# Patient Record
Sex: Female | Born: 1965 | Race: Black or African American | Hispanic: No | Smoking: Never smoker
Health system: Southern US, Community
[De-identification: ages and names within clinical notes are randomized; demographics above are authoritative.]

## PROBLEM LIST (undated history)

## (undated) HISTORY — PX: HERNIA REPAIR: SHX51

## (undated) HISTORY — PX: FOOT SURGERY: SHX648

---

## 2015-05-13 ENCOUNTER — Encounter (HOSPITAL_BASED_OUTPATIENT_CLINIC_OR_DEPARTMENT_OTHER): Payer: Self-pay | Admitting: *Deleted

## 2015-05-13 ENCOUNTER — Emergency Department (HOSPITAL_BASED_OUTPATIENT_CLINIC_OR_DEPARTMENT_OTHER)
Admission: EM | Admit: 2015-05-13 | Discharge: 2015-05-13 | Disposition: A | Discharge status: Home / Self Care | Payer: 59 | Attending: Emergency Medicine | Admitting: Emergency Medicine

## 2015-05-13 DIAGNOSIS — R1084 Generalized abdominal pain: Secondary | ICD-10-CM | POA: Diagnosis present

## 2015-05-13 DIAGNOSIS — R110 Nausea: Secondary | ICD-10-CM | POA: Insufficient documentation

## 2015-05-13 DIAGNOSIS — R197 Diarrhea, unspecified: Secondary | ICD-10-CM | POA: Diagnosis not present

## 2015-05-13 MED ORDER — ONDANSETRON 4 MG PO TBDP: ORAL_TABLET | ORAL | Status: AC

## 2015-05-13 MED ORDER — SODIUM CHLORIDE 0.9 % IV BOLUS (SEPSIS)
1000.0000 mL | Freq: Once | INTRAVENOUS | Status: AC
  Administered 2015-05-13: 1000 mL via INTRAVENOUS

## 2015-05-13 MED ORDER — ONDANSETRON HCL 4 MG/2ML IJ SOLN
4.0000 mg | Freq: Once | INTRAMUSCULAR | Status: AC
  Administered 2015-05-13: 4 mg via INTRAVENOUS
  Filled 2015-05-13: qty 2

## 2015-05-13 NOTE — ED Notes (Signed)
D/c home with rx x 1 for zofran- family present to drive

## 2015-05-13 NOTE — ED Notes (Signed)
Abdominal pain, nausea, body aches, diarrhea, and headache. Symptoms started this am.

## 2015-05-13 NOTE — ED Provider Notes (Signed)
CSN: [id number]     Arrival date & time 05/13/15  1737 History  By signing my name below, I, Bethel Born, attest that this documentation has been prepared under the direction and in the presence of Pricilla Loveless, MD. Electronically Signed: Bethel Born, ED Scribe. 05/13/2015. 6:13 PM  Chief Complaint  Patient presents with  . Abdominal Pain    The history is provided by the patient. No language interpreter was used.   Tiffany Vance is a 49 y.o. female with no significant medical history who presents to the Emergency Department complaining of diarrhea with onset this morning near 8:30 AM. Pt reports 6-7 episodes of loose stool with no blood. Associated symptoms include nausea, diffuse abdominal cramping, joint aches, and headache. Pepcin provided insufficient relief in symptoms at home. She associates her symptoms with carrot cake that she ate 2 days ago that appeared to contain "bad carrots".  Pt denies vomiting, fever, dysuria, cough, and joint swelling. She also denies recent travel and abx use.   History reviewed. No pertinent past medical history. Past Surgical History  Procedure Laterality Date  . Cesarean section    . Hernia repair    . Foot surgery     No family history on file. Social History  Substance Use Topics  . Smoking status: Never Smoker   . Smokeless tobacco: None  . Alcohol Use: No   OB History    No data available     Review of Systems  Constitutional: Negative for fever.  Respiratory: Negative for cough.   Gastrointestinal: Positive for nausea, abdominal pain and diarrhea. Negative for vomiting and blood in stool.  Genitourinary: Negative for dysuria.  Musculoskeletal: Positive for arthralgias. Negative for joint swelling.  All other systems reviewed and are negative.  Allergies  Review of patient's allergies indicates no known allergies.  Home Medications   Prior to Admission medications   Not on File   BP 125/52 mmHg  Pulse 86   Temp(Src) 98.8 F (37.1 C) (Oral)  Resp 20  Ht  (1.6 m)  Wt 155 lb (70.308 kg)  BMI 27.46 kg/m2  SpO2 100% Physical Exam  Constitutional: She is oriented to person, place, and time. She appears well-developed and well-nourished.  HENT:  Head: Normocephalic and atraumatic.  Right Ear: External ear normal.  Left Ear: External ear normal.  Nose: Nose normal.  Mouth/Throat: Mucous membranes are dry (mildly).  Eyes: Right eye exhibits no discharge. Left eye exhibits no discharge.  Cardiovascular: Normal rate, regular rhythm and normal heart sounds.   Pulmonary/Chest: Effort normal and breath sounds normal.  Abdominal: Soft. There is no tenderness.  Neurological: She is alert and oriented to person, place, and time.  Skin: Skin is warm and dry.  Nursing note and vitals reviewed.   ED Course  Procedures (including critical care time) DIAGNOSTIC STUDIES: Oxygen Saturation is 100% on RA,  normal by my interpretation.    COORDINATION OF CARE: 6:09 PM Discussed treatment plan which includes lab work, IVF, and an antiemetic with pt at bedside and pt agreed to plan.  Labs Review Labs Reviewed - No data to display  Imaging Review No results found. I have personally reviewed and evaluated these lab results as part of my medical decision-making.   EKG Interpretation None      MDM   Final diagnoses:  Diarrhea of presumed infectious origin    49 year old female with nausea and diarrhea that started today. Some abdominal cramping but no focal tenderness on my  exam. She appears mildly dehydrated. She was given IV fluids and feels much better. She declines laboratory testing, citing concern for cost. Given less than 24 hours of diarrhea have low suspicion for significant electrode abnormality or renal dysfunction. I did discuss his patient she still declines testing and given she feels very well after IV fluids she will be discharged with Zofran and discussed return precautions.  Likely a viral GI illness.  I personally performed the services described in this documentation, which was scribed in my presence. The recorded information has been reviewed and is accurate.   Pricilla LovelessScott Shameek Nyquist, MD 05/14/15 [phone]

## 2015-09-18 ENCOUNTER — Ambulatory Visit: Payer: 59 | Admitting: Cardiovascular Disease

## 2020-01-12 ENCOUNTER — Encounter (HOSPITAL_BASED_OUTPATIENT_CLINIC_OR_DEPARTMENT_OTHER): Payer: Self-pay | Admitting: Emergency Medicine

## 2020-01-12 ENCOUNTER — Other Ambulatory Visit: Payer: Self-pay

## 2020-01-12 DIAGNOSIS — Y939 Activity, unspecified: Secondary | ICD-10-CM | POA: Insufficient documentation

## 2020-01-12 DIAGNOSIS — M62838 Other muscle spasm: Secondary | ICD-10-CM | POA: Diagnosis not present

## 2020-01-12 DIAGNOSIS — Y999 Unspecified external cause status: Secondary | ICD-10-CM | POA: Insufficient documentation

## 2020-01-12 DIAGNOSIS — Y92410 Unspecified street and highway as the place of occurrence of the external cause: Secondary | ICD-10-CM | POA: Insufficient documentation

## 2020-01-12 DIAGNOSIS — S40021A Contusion of right upper arm, initial encounter: Secondary | ICD-10-CM | POA: Diagnosis not present

## 2020-01-12 DIAGNOSIS — R519 Headache, unspecified: Secondary | ICD-10-CM | POA: Diagnosis not present
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
## 2020-01-13 ENCOUNTER — Emergency Department (HOSPITAL_BASED_OUTPATIENT_CLINIC_OR_DEPARTMENT_OTHER)
Admission: EM | Admit: 2020-01-13 | Discharge: 2020-01-13 | Disposition: A | Discharge status: Home / Self Care | Payer: No Typology Code available for payment source | Attending: Emergency Medicine | Admitting: Emergency Medicine

## 2020-01-13 ENCOUNTER — Emergency Department (HOSPITAL_BASED_OUTPATIENT_CLINIC_OR_DEPARTMENT_OTHER): Payer: No Typology Code available for payment source

## 2020-01-13 DIAGNOSIS — S40021A Contusion of right upper arm, initial encounter: Secondary | ICD-10-CM

## 2020-01-13 DIAGNOSIS — M542 Cervicalgia: Secondary | ICD-10-CM

## 2020-01-13 DIAGNOSIS — M7918 Myalgia, other site: Secondary | ICD-10-CM

## 2020-01-13 DIAGNOSIS — M62838 Other muscle spasm: Secondary | ICD-10-CM

## 2020-01-13 MED ORDER — CYCLOBENZAPRINE HCL 5 MG PO TABS: 5.0000 mg | ORAL_TABLET | Freq: Two times a day (BID) | ORAL | 0 refills | Status: AC | PRN

## 2020-01-13 MED ORDER — CYCLOBENZAPRINE HCL 5 MG PO TABS: 5.0000 mg | ORAL_TABLET | Freq: Two times a day (BID) | ORAL | 0 refills | Status: DC | PRN

## 2020-01-13 NOTE — Discharge Instructions (Signed)
Your work-up today was overall reassuring and the imaging did not show any acute traumatic injuries.  He did have some degenerative change as we discussed.  Please follow-up with your primary doctor and use the muscle relaxant to help with the muscle spasms.  Please rest and stay hydrated.  If any symptoms change or worsen, please return to the nearest emergency department.
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
## 2020-01-13 NOTE — ED Notes (Signed)
Patient transported to X-ray 
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
